# Patient Record
Sex: Male | Born: 1935 | Race: White | Hispanic: No | Smoking: Former smoker
Health system: Southern US, Community
[De-identification: ages and names within clinical notes are randomized; demographics above are authoritative.]

## PROBLEM LIST (undated history)

## (undated) DIAGNOSIS — I10 Essential (primary) hypertension: Secondary | ICD-10-CM

## (undated) HISTORY — PX: HERNIA REPAIR: SHX51

## (undated) HISTORY — PX: REPLACEMENT TOTAL KNEE: SUR1224

## (undated) HISTORY — DX: Essential (primary) hypertension: I10

## (undated) HISTORY — PX: INSERT / REPLACE / REMOVE PACEMAKER: SUR710

---

## 1999-06-20 ENCOUNTER — Encounter: Admission: RE | Admit: 1999-06-20 | Discharge: 1999-09-18 | Payer: Self-pay | Admitting: *Deleted

## 1999-06-21 ENCOUNTER — Encounter: Payer: Self-pay | Admitting: Surgery

## 1999-06-21 ENCOUNTER — Ambulatory Visit (HOSPITAL_COMMUNITY): Admission: RE | Admit: 1999-06-21 | Discharge: 1999-06-21 | Payer: Self-pay | Admitting: Surgery

## 1999-08-25 ENCOUNTER — Encounter: Payer: Self-pay | Admitting: Surgery

## 1999-08-30 ENCOUNTER — Inpatient Hospital Stay (HOSPITAL_COMMUNITY): Admission: RE | Admit: 1999-08-30 | Discharge: 1999-09-06 | Payer: Self-pay | Admitting: Surgery

## 1999-10-11 ENCOUNTER — Ambulatory Visit (HOSPITAL_BASED_OUTPATIENT_CLINIC_OR_DEPARTMENT_OTHER): Admission: RE | Admit: 1999-10-11 | Discharge: 1999-10-11 | Payer: Self-pay | Admitting: Surgery

## 2000-03-11 ENCOUNTER — Ambulatory Visit (HOSPITAL_COMMUNITY): Admission: RE | Admit: 2000-03-11 | Discharge: 2000-03-11 | Payer: Self-pay | Admitting: Surgery

## 2005-07-30 ENCOUNTER — Ambulatory Visit: Payer: Self-pay | Admitting: Oncology

## 2007-04-20 ENCOUNTER — Emergency Department (HOSPITAL_COMMUNITY): Admission: EM | Admit: 2007-04-20 | Discharge: 2007-04-20 | Payer: Self-pay | Admitting: Emergency Medicine

## 2011-02-02 NOTE — Op Note (Signed)
[place]. [facility]  Patient:    Peter Cantu                      MRN: [id number] Proc. Date: 08/30/99 Adm. Date:  [id number] Attending:  Andre Lefort CC:         Lynann Bologna, M.D., [address] [place]             Nadine Counts, M.D., [place]             Gery Pray, M.D. [place]             Dr. Jackelyn Knife,  Dr. Othelia Pulling                           Operative Report  DATE OF BIRTH:  [birth year]  PREOPERATIVE DIAGNOSIS:  Rectal carcinoma.  POSTOPERATIVE DIAGNOSIS:  Rectal carcinoma on the anterior and right lateral rectum approximately 3 cm in size, and previously unrecognized diabetes mellitus.  OPERATION PERFORMED:  Abdominoperineal resection with left lower quadrant colostomy and appendectomy.  SURGEON:  Sandria Bales. Ezzard Standing, M.D.  FIRST ASSISTANT: 1. Thomas B. Samuella Cota, M.D. 2. Currie Paris, M.D.  ANESTHESIA:  General endotracheal.  ESTIMATED BLOOD LOSS:  800 cc.  DRAINS:  Two 19 Blake drains in the perineal area.  INDICATIONS FOR PROCEDURE:  Mr. Vencent Hauschild is a 77 year old white male who  sees Dr. Nadine Counts in [place].  He has diagnosed rectal carcinoma by Dr. Chales Abrahams, also of [place], which revealed an adenocarcinoma.  He has been seen and treated by Dr. Gery Pray and Dr. Jackelyn Knife with neoadjuvant chemoradiation therapy. He has now completed this treatment and comes for abdominoperineal resection.  DESCRIPTION OF PROCEDURE:  Patient placed in a lithotomy position, his arms to is side, his legs in stirrups.  He had a Foley catheter in place.  I had to dilate his meatus a little bit to do this.  He was given 1 gm of cefotetan at initiation of the procedure, had PAS stockings in place and a roll under his buttocks.  His abdomen and perineum were prepped with Betadine solution and sterilely draped. I started with a midline abdominal incision made with sharp dissection  carried own into the abdominal cavity.  This incision was made in the lower part of the abdomen.  Small bowel was run to the ligament of Treitz and terminal ileum was unremarkable.  The bowel was unremarkable though it had a lot of fatty appendices epiploica.  His liver had no obvious metastatic disease and stomach was unremarkable with nasogastric tube in good position.  I then removed his appendix, took the mesentery down with 2-0 silk suture, ligated the base with a 0 chromic suture and inverted the base with a 3-0 silk suture.  Attention was then turned towards the colon.  I divided the sigmoid colon to the midsigmoid colon, sigmoid colon, identified both the left and right ureters and  made sure these were lateral to the dissection.  I took the colon down dividing the sigmoid colon branches more cephalad.  Also I took down the superior ____________ branches and got into the presacral area.  I did much of the first part of the dissection with Kelly clamps and 2-0 silk ties.  As we got into the pelvis, I then used the Harmonic scalpel ____________ .  I got a good plane directed posteriorly down to the tip of the coccyx and to the lateral pedicles.  Certainly as we got  down in the pelvis, I ran into much more inflammation and inflammatory changes secondary to the radiation.  ____________ about half way off the prostate from above.  I thought was well free laterally.  I thought the ureters were well out of the dissection.  I then went below and made an elliptical incision around the anus which had been sewn closed.  I excised the subcutaneous tissues and levator muscles, excising out the distal colon.  The colon was densely adhered to the prostate which made it really getting in this plane somewhat difficult.  I think I got into the prostate, had to put three or four sutures into prostatic veins but I was unable to ligate these without a lot of difficulty.  The wound  was irrigated with saline approximately 2 L.  The levators and subcutaneous tissues were closed with interrupted 2-0 Vicryl sutures.  Two 19 Blake drains had been put in lateral to the lower incision down into the pelvic floor.  The drains were sewn in place with a 2-0 nylon closure.  The skin was closed with 2-0 nylon suture.  Then we turned up to the upper part of the abdomen.  We got ready to mature and make a colostomy.  The patient had extensive fatty colon with a lot of fatty appendices epiploica.  These were taken down using a hemostat and 3-0 silk ties.  I was able to get to where I could bring the colostomy through the abdominal wall without  lot of difficulty.  I had two fingerbreadths open which had been marked before surgery and this was in the left upper quadrant.  I went through the middle of he rectus abdominis muscle.  Again I made about a two fingerbreadth defect and brought the colon through.  I tacked the colon up using 3-0 Vicryl sutures inside the abdomen.  I then irrigated the abdomen with about 3 liters of saline.  I was able to close the peritoneal floor using the peritoneum and also I was able to swing  down a little segment of the mesentery of the sigmoid colon.  This closed the floor.  I was able to irrigate this out well.  The colostomy looked good.  I then closed the abdominal wall with interrupted #1 ____________ sutures, closed the skin with a skin gun.  I then matured the colostomy using interrupted 3-0 Vicryl sutures and then dressed the colostomy with a colostomy bag.  The perineum was dressed with 4 x 4s.  The abdominal wall was dressed with 4 x s. The drains coming out the lower abdomen and the midline wound was dressed.  The patient tolerated the procedure well.  I lost a modest amount of blood when I was struggling with the prostate and its attachment to the colon.  That was really, the only significant blood loss, but again blood loss  estimated 700 to 800 cc during the case.  We did not transfuse him at this time.  I will follow his  hemoglobin and hematocrit.  The sponge and needle counts were correct at end of  case.  The patient tolerated the procedure well. DD:  08/30/99 TD:  08/31/99 Job: [id number]

## 2011-02-02 NOTE — Op Note (Signed)
[place]. [facility]  Patient:    Peter Cantu, Peter Cantu                     MRN: [id number] Proc. Date: 03/11/00 Adm. Date:  [id number] Attending:  Andre Lefort CC:         Dr. Harrison Mons, [place]             Dr. Alphonzo Severance, [place]             Jackelyn Knife, M.D.             Dellia Beckwith, M.D.                           Operative Report  DATE OF BIRTH:  [birth year]  CCS# [id number]  PREOPERATIVE DIAGNOSIS:  Right inguinal hernia.  POSTOPERATIVE DIAGNOSIS:  Medium-sized indirect right inguinal hernia, large cord lipoma.  OPERATION PERFORMED:  Open right inguinal hernia repair with mesh with removal of cord lipoma.  SURGEON:  Sandria Bales. Ezzard Standing, M.D.  ASSISTANT:  None.  ANESTHESIA:  MAC with approximately 30 cc of local anesthetic.  COMPLICATIONS:  None.  INDICATIONS FOR PROCEDURE:  The patient is a 77 year old white male who had an abdominoperineal resection for a rectal carcinoma in December of 2000.  He developed a bulge and knot in his right groin which is consistent with an inguinal hernia.  He is disease-free at this time from his cancer.  DESCRIPTION OF PROCEDURE:  The patient was placed in a supine position, given a MAC anesthesia.  His right groin was shaved, prepped with Betadine solution and sterilely draped.  Sharp dissection was carried down through the skin after a local anesthetic was used.  The local anesthetic was 0.25% Marcaine with epinephrine, with 1% Xylocaine with epinephrine mixed with sodium bicarb. The incision was carried down to the external oblique fascia.  The external oblique fascia was opened.  The cord structures were encircled with a Penrose drain.  The inguinal floor was small but intact but the patient had a large patulous indirect ring with a large lipoma protruding through the cord.  The lipoma probably measured about 12 cm in length about 4 cm in width.  On the anteromedial surface of the cord was an  inguinal hernia sac which was actually fairly broadbased, probably some 2.5 cm in diameter.  It protruded about 7 or 8 cm.  ____________ ligated and removed, was not sent to pathology.  The hernia sac was opened.  There was no bowel in the hernia sac but I had to reduce some of the fat in the hernia sac.  The hernia sac was then twisted and ligated with a 0 chromic suture.  The hernia sac was dissected off and this was sent to pathology.  The wound was then irrigated.  The inguinal floor repair was carried out using a piece of atrium mesh.  The mesh used was 3 x 6 which was cut down to about 2 x 4.5 to cover the inguinal floor.  It was sewn in medially to the pubic bone, inferiorly to the shelving edge of the inguinal ligament, superiorly to the transversalis fascia.  A keyhole was cut from the internal ring and then sewed behind it laterally.  The wound was then irrigated.  The cord structures returned to their normal location.  The external oblique fascia was then closed with interrupted 3-0 Vicryl sutures.  The  subcutaneous tissues were closed with 3-0 Vicryl suture. The skin closed with a 5-0 Vicryl subcuticular suture and painted with tincture of benzoin and steri-stripped.  The patient tolerated the procedure well and was transported to the recovery room in good condition.  The sponge and needle counts were correct at the end of the case. DD:  03/11/00 TD:  03/12/00 Job: [id number]

## 2015-12-13 DIAGNOSIS — E119 Type 2 diabetes mellitus without complications: Secondary | ICD-10-CM | POA: Insufficient documentation

## 2015-12-13 DIAGNOSIS — I10 Essential (primary) hypertension: Secondary | ICD-10-CM | POA: Insufficient documentation

## 2015-12-13 DIAGNOSIS — I429 Cardiomyopathy, unspecified: Secondary | ICD-10-CM | POA: Insufficient documentation

## 2016-07-26 DIAGNOSIS — I519 Heart disease, unspecified: Secondary | ICD-10-CM | POA: Insufficient documentation

## 2016-07-26 DIAGNOSIS — I4729 Other ventricular tachycardia: Secondary | ICD-10-CM | POA: Insufficient documentation

## 2016-07-26 DIAGNOSIS — I472 Ventricular tachycardia: Secondary | ICD-10-CM | POA: Insufficient documentation

## 2016-07-26 DIAGNOSIS — I420 Dilated cardiomyopathy: Secondary | ICD-10-CM | POA: Insufficient documentation

## 2016-08-02 DIAGNOSIS — Z22322 Carrier or suspected carrier of Methicillin resistant Staphylococcus aureus: Secondary | ICD-10-CM | POA: Insufficient documentation

## 2016-08-07 DIAGNOSIS — Z95810 Presence of automatic (implantable) cardiac defibrillator: Secondary | ICD-10-CM | POA: Insufficient documentation

## 2016-10-18 DIAGNOSIS — I480 Paroxysmal atrial fibrillation: Secondary | ICD-10-CM | POA: Insufficient documentation

## 2016-12-13 ENCOUNTER — Ambulatory Visit: Payer: Self-pay | Admitting: Sports Medicine

## 2017-04-15 ENCOUNTER — Ambulatory Visit (INDEPENDENT_AMBULATORY_CARE_PROVIDER_SITE_OTHER): Payer: Medicare Other | Admitting: Cardiology

## 2017-04-15 ENCOUNTER — Encounter: Payer: Self-pay | Admitting: Cardiology

## 2017-04-15 ENCOUNTER — Telehealth: Payer: Self-pay

## 2017-04-15 ENCOUNTER — Ambulatory Visit (HOSPITAL_BASED_OUTPATIENT_CLINIC_OR_DEPARTMENT_OTHER)
Admission: RE | Admit: 2017-04-15 | Discharge: 2017-04-15 | Disposition: A | Discharge status: Home / Self Care | Payer: Medicare Other | Source: Ambulatory Visit | Attending: Cardiology | Admitting: Cardiology

## 2017-04-15 VITALS — BP 112/60 | HR 72 | Resp 10 | Ht 70.0 in | Wt 176.1 lb

## 2017-04-15 DIAGNOSIS — E119 Type 2 diabetes mellitus without complications: Secondary | ICD-10-CM

## 2017-04-15 DIAGNOSIS — J9811 Atelectasis: Secondary | ICD-10-CM | POA: Diagnosis not present

## 2017-04-15 DIAGNOSIS — I480 Paroxysmal atrial fibrillation: Secondary | ICD-10-CM

## 2017-04-15 DIAGNOSIS — I700 Atherosclerosis of aorta: Secondary | ICD-10-CM | POA: Insufficient documentation

## 2017-04-15 DIAGNOSIS — I10 Essential (primary) hypertension: Secondary | ICD-10-CM | POA: Diagnosis not present

## 2017-04-15 DIAGNOSIS — Z95810 Presence of automatic (implantable) cardiac defibrillator: Secondary | ICD-10-CM | POA: Diagnosis not present

## 2017-04-15 DIAGNOSIS — R918 Other nonspecific abnormal finding of lung field: Secondary | ICD-10-CM | POA: Insufficient documentation

## 2017-04-15 DIAGNOSIS — R0602 Shortness of breath: Secondary | ICD-10-CM

## 2017-04-15 DIAGNOSIS — I420 Dilated cardiomyopathy: Secondary | ICD-10-CM | POA: Diagnosis not present

## 2017-04-15 NOTE — Patient Instructions (Signed)
Medication Instructions:  Please increase the Amiodarone to 200 mg daily.   Labwork: Your physician recommends that you have labs drawn today: Pro-BNP to check for possible fluid build up.   Testing/Procedures: A chest x-ray takes a picture of the organs and structures inside the chest, including the heart, lungs, and blood vessels. This test can show several things, including, whether the heart is enlarges; whether fluid is building up in the lungs; and whether pacemaker / defibrillator leads are still in place.  Follow-Up: Your physician recommends that you schedule a follow-up appointment in: 1 month with Dr. Bing MatterKrasowski   Any Other Special Instructions Will Be Listed Below (If Applicable).     If you need a refill on your cardiac medications before your next appointment, please call your pharmacy.

## 2017-04-15 NOTE — Telephone Encounter (Signed)
-----   Message from Georgeanna Leaobert J Krasowski, MD sent at 04/15/2017  2:03 PM EDT ----- CXR abnormal, CT of the chest needs to be done

## 2017-04-15 NOTE — Progress Notes (Signed)
Cardiology Office Note:    Date:  04/15/2017   ID:  Peter HolmesClaude J Bultman, DOB 1928-02-04, MRN [id number]  PCP:  Nonnie DoneSlatosky, John J., MD  Cardiologist:  Gypsy Balsamobert Nyasha Rahilly, MD    Referring MD: No ref. provider found   Chief Complaint  Patient presents with  . Follow-up  I'm anemic, I am losing weight, I have a cough and shortness of breath  History of Present Illness:    Peter Cantu is a 83 y.o. male  with history of coronary myopathy, ICD implanted. He is coming to me for regular follow-up. He complaining of having some shortness of breath as well as some cough with clear sputum. Denies having any discharges from the defibrillator. I will review EP note which said that he is improving in terms of ejection fraction. Apparently echo was done which showed ejection fraction of 45%. Concern is for his chronic anemia. Colonoscopy was done and no source of bleeding was identified. He he received 2 infusions of iron and seems to be doing better.  Past Medical History:  Diagnosis Date  . Hypertension     Past Surgical History:  Procedure Laterality Date  . HERNIA REPAIR    . INSERT / REPLACE / REMOVE PACEMAKER     Biotronik  . REPLACEMENT TOTAL KNEE      Current Medications: Current Meds  Medication Sig  . amiodarone (PACERONE) 200 MG tablet Take 100 mg by mouth daily.  Marland Kitchen. aspirin EC 81 MG tablet Take 81 mg by mouth daily.  . carvedilol (COREG) 12.5 MG tablet Take 12.5 mg by mouth 2 (two) times daily with a meal. 1 tablet in the morning and 1/2 tablet in the afternoon  . enalapril (VASOTEC) 10 MG tablet Take 1 tablet by mouth 2 (two) times daily.  Marland Kitchen. glipiZIDE (GLUCOTROL) 5 MG tablet Take 1.25-2.5 mg by mouth daily.  . hydrochlorothiazide (HYDRODIURIL) 25 MG tablet Take 1 tablet by mouth daily.      Allergies:   Patient has no known allergies.   Social History   Social History  . Marital status: Married    Spouse name: N/A  . Number of children: N/A  . Years of education: N/A    Social History Main Topics  . Smoking status: Former Games developermoker  . Smokeless tobacco: Never Used  . Alcohol use No  . Drug use: No  . Sexual activity: Not Asked   Other Topics Concern  . None   Social History Narrative  . None     Family History: The patient's family history includes Cancer in his mother; Heart disease in his father. ROS:   Please see the history of present illness.    All 14 point review of systems negative except as described per history of present illness  EKGs/Labs/Other Studies Reviewed:      Recent Labs: No results found for requested labs within last 8760 hours.  Recent Lipid Panel No results found for: CHOL, TRIG, HDL, CHOLHDL, VLDL, LDLCALC, LDLDIRECT  Physical Exam:    VS:  BP 112/60   Pulse 72   Resp 10   Ht 5\' 10"  (1.778 m)   Wt 176 lb 1.9 oz (79.9 kg)   BMI 25.27 kg/m     Wt Readings from Last 3 Encounters:  04/15/17 176 lb 1.9 oz (79.9 kg)     GEN:  Well nourished, well developed in no acute distress HEENT: Normal NECK: No JVD; No carotid bruits LYMPHATICS: No lymphadenopathy CARDIAC: RRR, no murmurs, no rubs,  no gallops RESPIRATORY:  Clear to auscultation without rales, wheezing or rhonchi  ABDOMEN: Soft, non-tender, non-distended MUSCULOSKELETAL:  No edema; No deformity  SKIN: Warm and dry LOWER EXTREMITIES: no swelling NEUROLOGIC:  Alert and oriented x 3 PSYCHIATRIC:  Normal affect   ASSESSMENT:    1. Dilated cardiomyopathy (HCC)   2. Essential hypertension   3. Paroxysmal atrial fibrillation (HCC)   4. Type 2 diabetes mellitus without complication, without long-term current use of insulin (HCC)   5. Presence of automatic (implantable) cardiac defibrillator    PLAN:    In order of problems listed above:  Dilated cardio myopathy: Apparently improvement in left ventricular ejection fraction. We will retrieve echocardiogram to check on that. We'll continue present medications. Complaining of having shortness of breath  I will check proBNP to verify level Essential hypertension: Blood pressure well controlled we'll continue present management. Paroxysmal atrial fibrillation. Apparently this clinical correlation between him feeling poorly and having episodes of atrial fibrillation this is based on interrogation of his device. Therefore I think we need to concentrate on controlling his rhythm. Haskins increased dose of amiodarone to 200 mg every single day. He cannot be anticoagulated because of significant anemia. Therefore, controlling rhythm appears to be the more important. Type 2 diabetes: Followed by internal medicine team. ICD: Follow by group in [place].  Medication Adjustments/Labs and Tests Ordered: Current medicines are reviewed at length with the patient today.  Concerns regarding medicines are outlined above.  No orders of the defined types were placed in this encounter.  Medication changes: No orders of the defined types were placed in this encounter.   Signed, Georgeanna Leaobert J. Junie Engram, MD, [facility] 04/15/2017 10:51 AM    [facility]

## 2017-04-16 ENCOUNTER — Telehealth: Payer: Self-pay

## 2017-04-16 LAB — PRO B NATRIURETIC PEPTIDE: NT-PRO BNP: 7901 pg/mL — AB (ref 0–486)

## 2017-04-16 MED ORDER — FUROSEMIDE 20 MG PO TABS: 20.0000 mg | ORAL_TABLET | Freq: Every day | ORAL | 6 refills | Status: AC

## 2017-04-16 NOTE — Telephone Encounter (Signed)
S/w pt and discuss medication start of lasix. Pt verbalized understanding. I will arrange lab follow up tomorrow after CT of the Chest has been competed.

## 2017-04-16 NOTE — Telephone Encounter (Signed)
-----   Message from Georgeanna Leaobert J Krasowski, MD sent at 04/16/2017  1:25 PM EDT ----- Pro BNP elevated, add lasix20 mg po qd chem7 and pro BNP in 1 week

## 2017-04-29 ENCOUNTER — Telehealth: Payer: Self-pay | Admitting: Cardiology

## 2017-04-29 NOTE — Telephone Encounter (Signed)
S/w daughter and advised that PCP office is send referral to Dr. Terrilee Croakhodri's office. I have faxed last office note and c-xray to Dr. Terrilee Croakhodri's office. PCP suggest they will send copy of CT.

## 2017-04-29 NOTE — Telephone Encounter (Signed)
Patient's daughter is calling for results of lung CT [facility] and she is upset because she sees where it states that he has some masses in his lungs. Please call her with results. I will have someone print report..Marland Kitchen

## 2017-04-29 NOTE — Telephone Encounter (Signed)
S/w daughter and have discussed these results and the treatment plan at which Dr. Bing MatterKrasowski had discussed with PCP. Dr. Bing MatterKrasowski had also advised that the PCP would be discussing these results with the pt. Daughter is ok with this and I have encouraged her that I would let Dr. Bing MatterKrasowski know that the PCP has yet to call the pt and that I would call the PCP as well.

## 2017-05-13 ENCOUNTER — Ambulatory Visit: Payer: Medicare Other | Admitting: Cardiology

## 2017-05-13 DIAGNOSIS — R918 Other nonspecific abnormal finding of lung field: Secondary | ICD-10-CM

## 2017-06-17 DEATH — deceased

## 2017-12-03 IMAGING — DX DG CHEST 2V
2 series · 2 of 2 positions shown · non-contrast
Comparison: Radiograph August 07, 2016 and August 02, 2016.

CLINICAL DATA: Shortness of breath.

EXAM:
CHEST  2 VIEW

[chest pa]
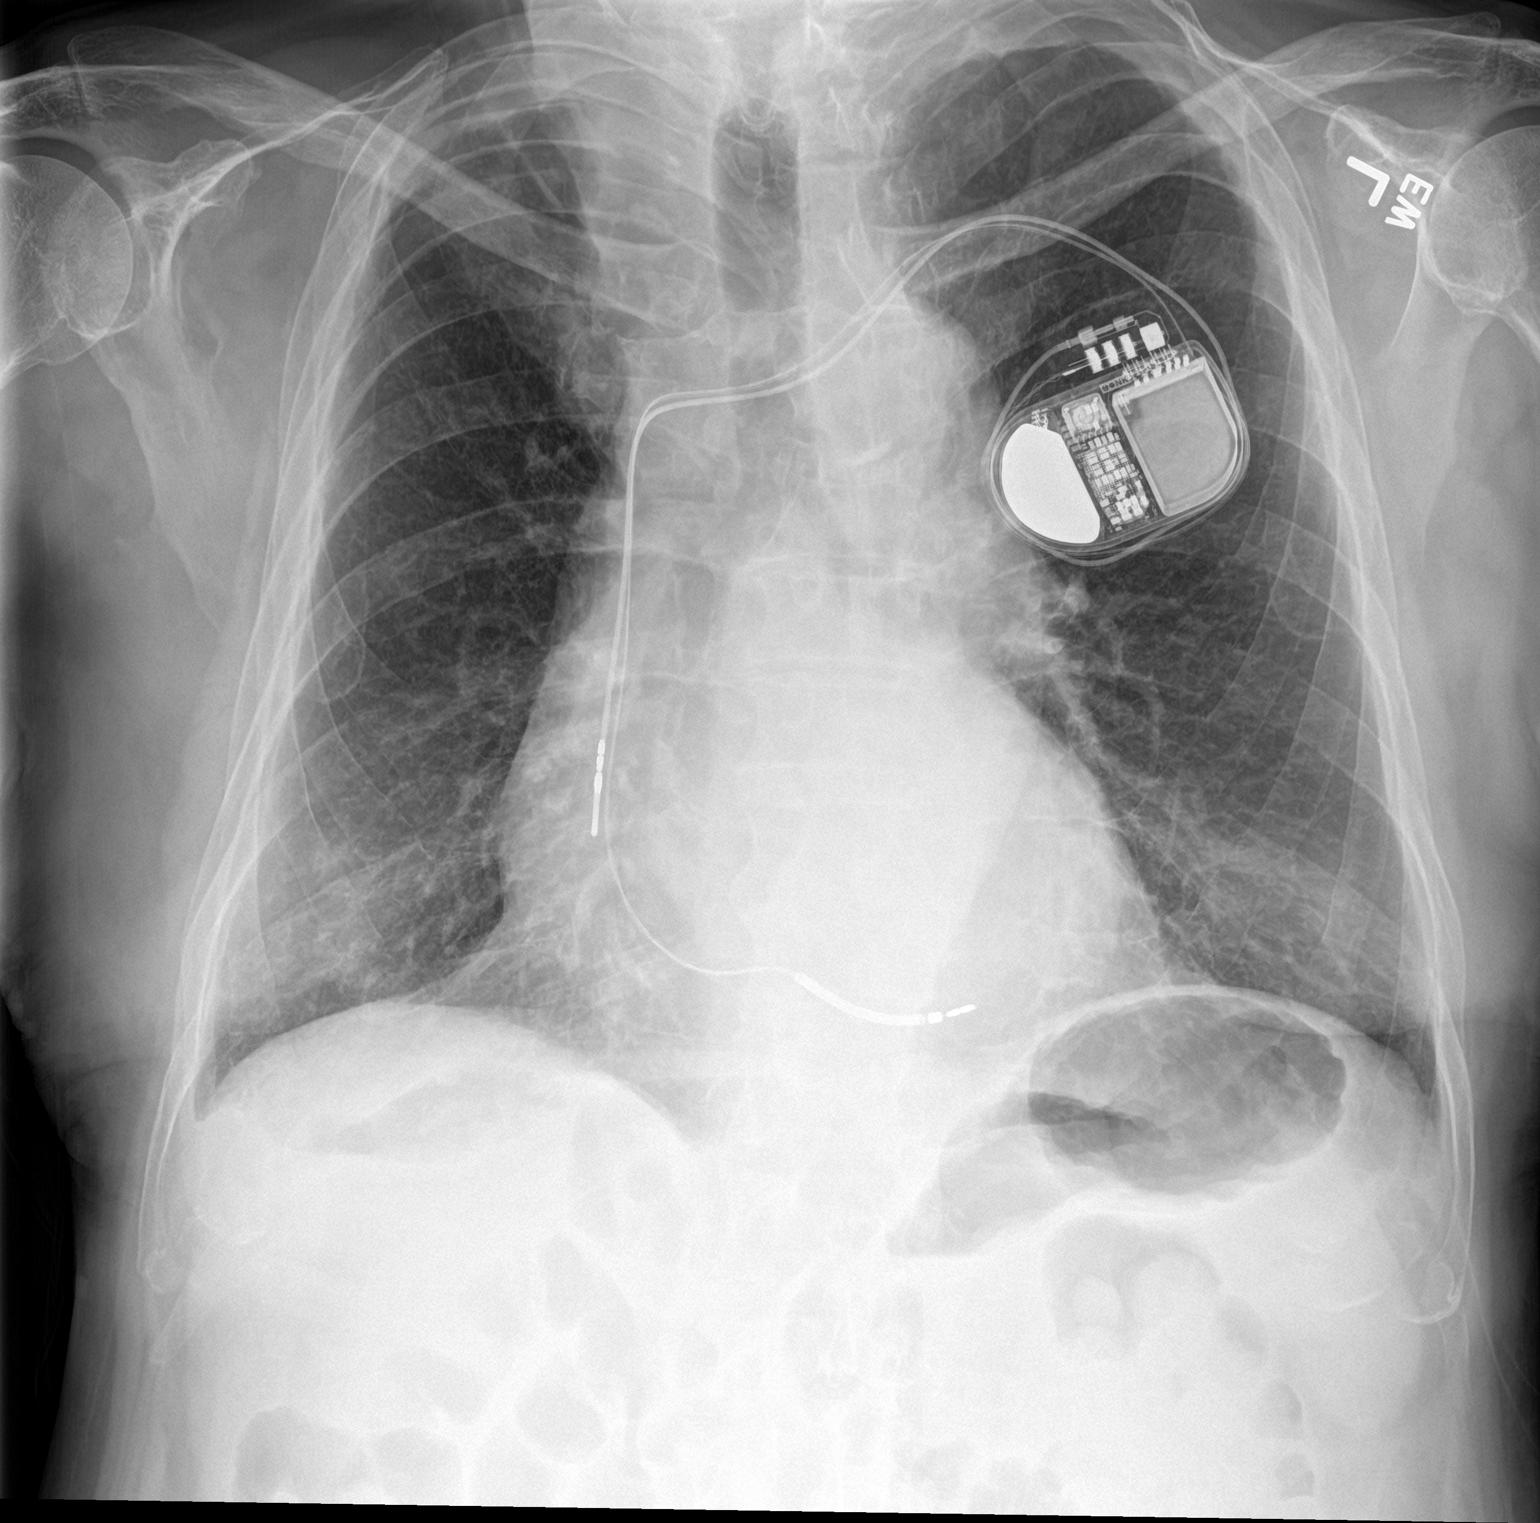

[chest lat]
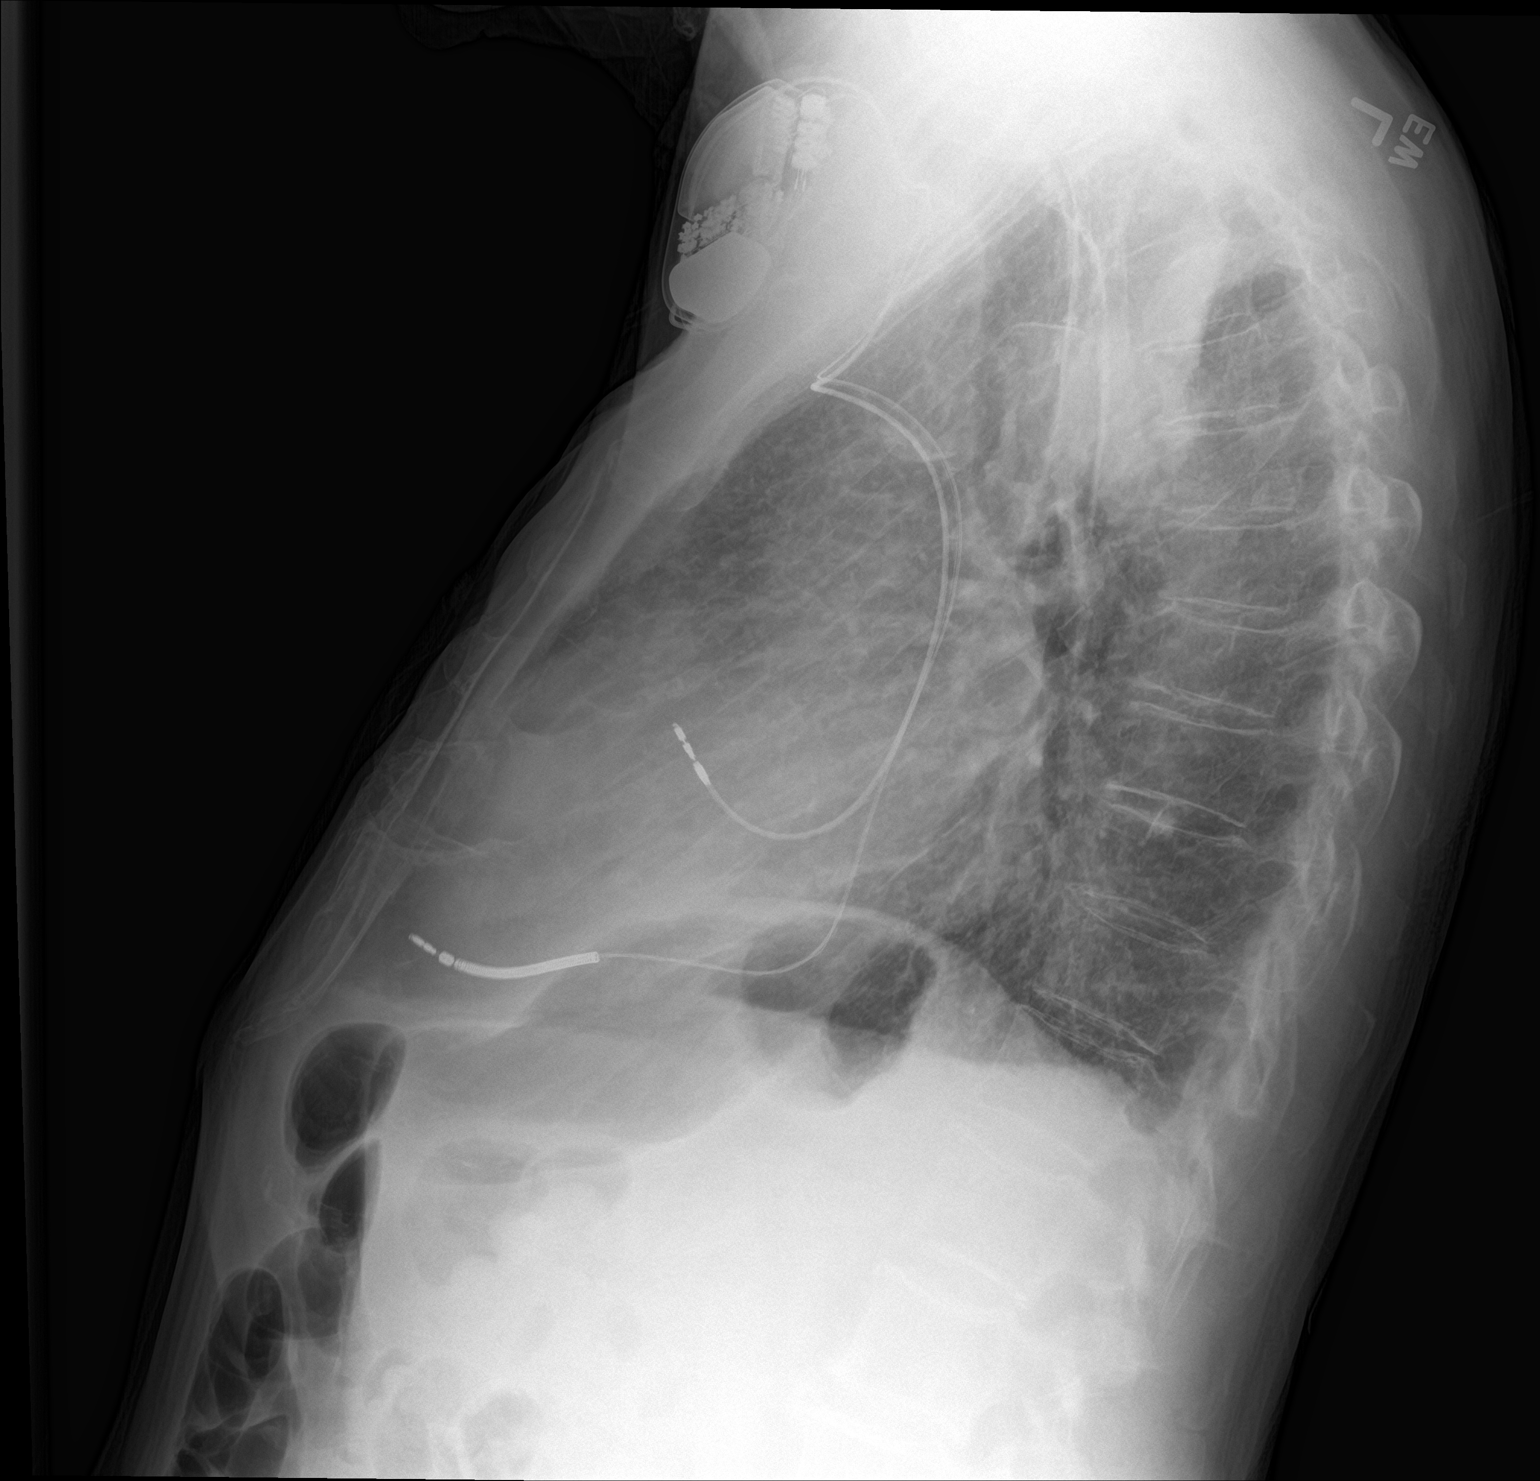

[2 of 2 positions shown; findings below may reference images not displayed]

FINDINGS: Stable cardiomediastinal silhouette. Left-sided pacemaker is
unchanged in position. No pneumothorax or pleural effusion is noted.
Left lung is clear. Mild right basilar atelectasis or infiltrate is
noted. On the lateral radiograph, there is noted wedge-shaped
opacity projected over the upper part of the chest which may
represent atelectasis in 1 of the lungs. Atherosclerosis of thoracic
aorta is noted. Bony thorax is unremarkable.
IMPRESSION: Aortic atherosclerosis. Mild right basilar atelectasis or infiltrate
is noted.

There is noted large wedge-shaped opacity projected over the upper
part of 1 of the lung seen on the lateral radiograph concerning for
atelectasis or possibly pneumonia. Potentially this may be related
to central endobronchial lesion or neoplasm. CT scan of the chest is
recommended for further evaluation.
